# Patient Record
Sex: Male | Born: 1975 | Race: White | Hispanic: No | Marital: Married | Smoking: Never smoker
Health system: Southern US, Community
[De-identification: ages and names within clinical notes are randomized; demographics above are authoritative.]

## PROBLEM LIST (undated history)

## (undated) HISTORY — PX: APPENDECTOMY: SHX54

---

## 2004-08-19 ENCOUNTER — Observation Stay: Payer: Self-pay | Admitting: Surgery

## 2008-05-03 ENCOUNTER — Emergency Department: Payer: Self-pay | Admitting: Unknown Physician Specialty

## 2014-12-25 ENCOUNTER — Emergency Department
Admission: EM | Admit: 2014-12-25 | Discharge: 2014-12-25 | Disposition: A | Discharge status: Home / Self Care | Payer: No Typology Code available for payment source | Attending: Emergency Medicine | Admitting: Emergency Medicine

## 2014-12-25 ENCOUNTER — Emergency Department: Payer: No Typology Code available for payment source

## 2014-12-25 ENCOUNTER — Encounter: Payer: Self-pay | Admitting: Emergency Medicine

## 2014-12-25 DIAGNOSIS — S4992XA Unspecified injury of left shoulder and upper arm, initial encounter: Secondary | ICD-10-CM | POA: Insufficient documentation

## 2014-12-25 DIAGNOSIS — Y998 Other external cause status: Secondary | ICD-10-CM | POA: Diagnosis not present

## 2014-12-25 DIAGNOSIS — Y9389 Activity, other specified: Secondary | ICD-10-CM | POA: Insufficient documentation

## 2014-12-25 DIAGNOSIS — Y92410 Unspecified street and highway as the place of occurrence of the external cause: Secondary | ICD-10-CM | POA: Insufficient documentation

## 2014-12-25 DIAGNOSIS — S46002A Unspecified injury of muscle(s) and tendon(s) of the rotator cuff of left shoulder, initial encounter: Secondary | ICD-10-CM | POA: Insufficient documentation

## 2014-12-25 DIAGNOSIS — M25512 Pain in left shoulder: Secondary | ICD-10-CM

## 2014-12-25 DIAGNOSIS — M75102 Unspecified rotator cuff tear or rupture of left shoulder, not specified as traumatic: Secondary | ICD-10-CM

## 2014-12-25 MED ORDER — ETODOLAC 500 MG PO TABS: 500.0000 mg | ORAL_TABLET | Freq: Two times a day (BID) | ORAL | Status: AC

## 2014-12-25 MED ORDER — CYCLOBENZAPRINE HCL 5 MG PO TABS: 5.0000 mg | ORAL_TABLET | Freq: Three times a day (TID) | ORAL | Status: AC | PRN
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
## 2014-12-25 NOTE — ED Notes (Signed)
Discussed discharge instructions, prescriptions, and follow-up care with patient. No questions or concerns at this time. Pt stable at discharge.  
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
## 2019-06-09 ENCOUNTER — Emergency Department: Payer: BLUE CROSS/BLUE SHIELD

## 2019-06-09 ENCOUNTER — Other Ambulatory Visit: Payer: Self-pay

## 2019-06-09 ENCOUNTER — Emergency Department
Admission: EM | Admit: 2019-06-09 | Discharge: 2019-06-10 | Disposition: A | Discharge status: Home / Self Care | Payer: BLUE CROSS/BLUE SHIELD | Attending: Emergency Medicine | Admitting: Emergency Medicine

## 2019-06-09 DIAGNOSIS — R0781 Pleurodynia: Secondary | ICD-10-CM

## 2019-06-09 DIAGNOSIS — R600 Localized edema: Secondary | ICD-10-CM | POA: Diagnosis not present

## 2019-06-09 LAB — BRAIN NATRIURETIC PEPTIDE: B Natriuretic Peptide: 20 pg/mL (ref 0.0–100.0)

## 2019-06-09 LAB — FIBRIN DERIVATIVES D-DIMER (ARMC ONLY): Fibrin derivatives D-dimer (ARMC): 261.63 ng/mL (FEU) (ref 0.00–499.00)

## 2019-06-09 LAB — BASIC METABOLIC PANEL
Anion gap: 10 (ref 5–15)
BUN: 17 mg/dL (ref 6–20)
CO2: 25 mmol/L (ref 22–32)
Calcium: 9.2 mg/dL (ref 8.9–10.3)
Chloride: 102 mmol/L (ref 98–111)
Creatinine, Ser: 1.05 mg/dL (ref 0.61–1.24)
GFR calc Af Amer: >60 mL/min (ref >60)
GFR calc non Af Amer: >60 mL/min (ref >60)
Glucose, Bld: 107 mg/dL — ABNORMAL HIGH (ref 70–99)
Potassium: 3.6 mmol/L (ref 3.5–5.1)
Sodium: 137 mmol/L (ref 135–145)

## 2019-06-09 LAB — CBC
HCT: 48.9 % (ref 39.0–52.0)
Hemoglobin: 16.4 g/dL (ref 13.0–17.0)
MCH: 29.8 pg (ref 26.0–34.0)
MCHC: 33.5 g/dL (ref 30.0–36.0)
MCV: 88.7 fL (ref 80.0–100.0)
Platelets: 129 10*3/uL — ABNORMAL LOW (ref 150–400)
RBC: 5.51 MIL/uL (ref 4.22–5.81)
RDW: 11.9 % (ref 11.5–15.5)
WBC: 7 10*3/uL (ref 4.0–10.5)
nRBC: 0 % (ref 0.0–0.2)

## 2019-06-09 MED ORDER — SODIUM CHLORIDE 0.9% FLUSH: 3.0000 mL | Freq: Once | INTRAVENOUS | Status: DC

## 2019-06-09 MED ORDER — IOHEXOL 350 MG/ML SOLN
100.0000 mL | Freq: Once | INTRAVENOUS | Status: AC | PRN
  Administered 2019-06-09: 100 mL via INTRAVENOUS

## 2019-06-09 NOTE — ED Provider Notes (Addendum)
[facility] Emergency Department Provider Note   ____________________________________________   First MD Initiated Contact with Patient 06/09/19 2058     (approximate)  I have reviewed the triage vital signs and the nursing notes.   HISTORY  Chief Complaint Leg Swelling    HPI Scott Alexander is a 44 y.o. male patient complains of bilateral leg swelling. He has been wearing compression stockings but has some swelling anyway mostly down around the ankles he has some pain in the calves as well and some redness in his feet. He is worried about DVTs and PEs. He is having some chest pain with inspiration but gets better when he walks. I will bit of cough productive of some clear mucus. Not running a fever. Is been going on for several days.         History reviewed. No pertinent past medical history.  There are no problems to display for this patient.   History reviewed. No pertinent surgical history.  Prior to Admission medications   Medication Sig Start Date End Date Taking? Authorizing Provider  cyclobenzaprine (FLEXERIL) 5 MG tablet Take 1-2 tablets (5-10 mg total) by mouth every 8 (eight) hours as needed for muscle spasms. 12/25/14   Duanne Guess, PA-C  etodolac (LODINE) 500 MG tablet Take 1 tablet (500 mg total) by mouth 2 (two) times daily. 12/25/14   Duanne Guess, PA-C    Allergies Patient has no known allergies.  History reviewed. No pertinent family history.  Social History Social History   Tobacco Use  . Smoking status: Never Smoker  Substance Use Topics  . Alcohol use: Yes  . Drug use: No    Review of Systems  Constitutional: No fever/chills Eyes: No visual changes. ENT: No sore throat. Cardiovascular:  chest pain. Respiratory: Occasional slight shortness of breath. Gastrointestinal: No abdominal pain.  No nausea, no vomiting.  No diarrhea.  No constipation. Genitourinary: Negative for dysuria. Musculoskeletal:  Negative for back pain. Skin: Negative for rash. Neurological: Negative for headaches, focal weakness   ____________________________________________   PHYSICAL EXAM:  VITAL SIGNS: ED Triage Vitals  Enc Vitals Group     BP 06/09/19 1517 (!) 171/105     Pulse Rate 06/09/19 1517 79     Resp 06/09/19 1517 18     Temp 06/09/19 1517 98.4 F (36.9 C)     Temp Source 06/09/19 1517 Oral     SpO2 06/09/19 1517 97 %     Weight 06/09/19 1518 (!) 340 lb (154.2 kg)     Height 06/09/19 1518 6\' 2"  (1.88 m)     Head Circumference --      Peak Flow --      Pain Score 06/09/19 1517 1     Pain Loc --      Pain Edu? --      Excl. in [place]? --    Constitutional: Alert and oriented. Well appearing and in no acute distress. Head: Atraumatic. Nose: No congestion/rhinnorhea. Mouth/Throat: Mucous membranes are moist.  Oropharynx non-erythematous. Neck: No stridor.   Cardiovascular: Normal rate, regular rhythm. Grossly normal heart sounds.  Good peripheral circulation. Respiratory: Normal respiratory effort.  No retractions. Lungs CTAB. Gastrointestinal: Soft and nontender. No distention. No abdominal bruits. No CVA tenderness. Musculoskeletal: No lower extremity tenderness bilateral lower leg and ankle edema.   Neurologic:  Normal speech and language. No gross focal neurologic deficits are appreciated.  Skin:  Skin is warm, dry and intact. No rash noted. Minimal redness in  both feet on the dorsal parts   ____________________________________________   LABS (all labs ordered are listed, but only abnormal results are displayed)  Labs Reviewed  BASIC METABOLIC PANEL - Abnormal; Notable for the following components:      Result Value   Glucose, Bld 107 (*)    All other components within normal limits  CBC - Abnormal; Notable for the following components:   Platelets 129 (*)    All other components within normal limits  BRAIN NATRIURETIC PEPTIDE  FIBRIN DERIVATIVES D-DIMER ([facility] ONLY)    ____________________________________________  EKG EKG read interpreted by me shows normal sinus rhythm at 83 right axis decreased R wave progression in the chest leads and there is an S1Q3T3 present but in my experience this is only indicative of PE about 50% of the time.  ____________________________________________  RADIOLOGY  ED MD interpretation: Rest x-ray read by radiology reviewed by me is negative  Official radiology report(s): DG Chest 2 View  Result Date: 06/09/2019 CLINICAL DATA:  Bilateral lower extremity edema for 1 week, constant chest pain for 2 days EXAM: CHEST - 2 VIEW COMPARISON:  None. FINDINGS: Frontal and lateral views of the chest demonstrate an unremarkable cardiac silhouette. No airspace disease, effusion, or pneumothorax. No acute bony abnormalities. IMPRESSION: 1. No acute intrathoracic process. Electronically Signed   By: Sharlet Salina M.D.   On: 06/09/2019 17:39    ____________________________________________   PROCEDURES  Procedure(s) performed (including Critical Care):  Procedures   ____________________________________________   INITIAL IMPRESSION / ASSESSMENT AND PLAN / ED COURSE  Patient is EKG is okay except for his S1Q3T3 .  His D-dimer is negative his protein in his blood is okay his kidney function is okay and his ultrasound of his legs is negative except for a Baker's cyst.  I am not sure why he is having edema in his legs but there is no sign of a DVT.  With a negative D-dimer negative ultrasound CT could be avoided but after discussing this in detail with the patient he would rather be safer so we will go ahead and do CT angio.  I will sign him out.         ____________________________________________   FINAL CLINICAL IMPRESSION(S) / ED DIAGNOSES  Final diagnoses:  Leg edema  Pleuritic chest pain     ED Discharge Orders    None       Note:  This document was prepared using Dragon voice recognition software and may  include unintentional dictation errors.    Arnaldo Natal, MD 06/09/19 2315    Arnaldo Natal, MD 06/09/19 [phone]

## 2019-06-09 NOTE — ED Notes (Signed)
Pt currently in ultrasound

## 2019-06-09 NOTE — ED Notes (Signed)
Pt back from ultrasound, sitting in chair. Pt states coming in with SOB with deep breaths and leg swelling for over several days.  Pt denies current pain

## 2019-06-09 NOTE — ED Notes (Signed)
Pt transported to US at this time. 

## 2019-06-09 NOTE — ED Triage Notes (Signed)
Pt arrives via POV for c/o edema in both lower legs x 1 week accompanied with numbness in the toes. Pt has black compression stocks on both legs but there is noticeable swelling to both ankles. PT gait steady from lobby and pt is in NAD.

## 2019-06-09 NOTE — ED Notes (Signed)
Pt in CT.

## 2019-06-10 MED ORDER — NAPROXEN 500 MG PO TABS: 500.0000 mg | ORAL_TABLET | Freq: Two times a day (BID) | ORAL | 0 refills | Status: AC

## 2019-06-10 NOTE — ED Provider Notes (Signed)
Procedures     ----------------------------------------- 12:35 AM on 06/10/2019 ----------------------------------------- Vitals remain normal.  CT angiogram unremarkable.  Overall very reassuring work-up, stable for outpatient follow-up.     Sharman Cheek, MD 06/10/19 [phone]

## 2019-06-10 NOTE — Discharge Instructions (Addendum)
Your tests today were all okay, including blood tests, ultrasounds of the legs, and a CT scan of the chest.  We are unable to find an exact cause of your symptoms, but your evaluation is reassuring. Please follow up with primary care if your symptoms have not resolved in the next 2 days.  Anti-inflammatory medicine such as naproxen may help alleviate your symptoms.

## 2021-12-28 IMAGING — US US EXTREM LOW VENOUS
1 series · 14 of 24 positions shown · non-contrast
Comparison: None.

CLINICAL DATA: Bilateral leg swelling

EXAM:
BILATERAL LOWER EXTREMITY VENOUS DOPPLER ULTRASOUND
TECHNIQUE: Gray-scale sonography with compression, as well as color and duplex
ultrasound, were performed to evaluate the deep venous system(s)
from the level of the common femoral vein through the popliteal and
proximal calf veins.

[Series 1: us venous img lower bilat (dvt) · portal-venous · 14 of 63 slices shown]
[im 1/63]
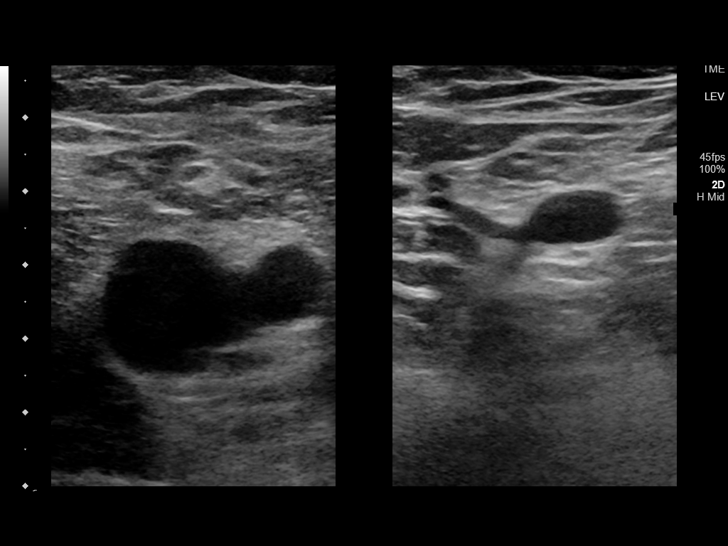
[im 6/63]
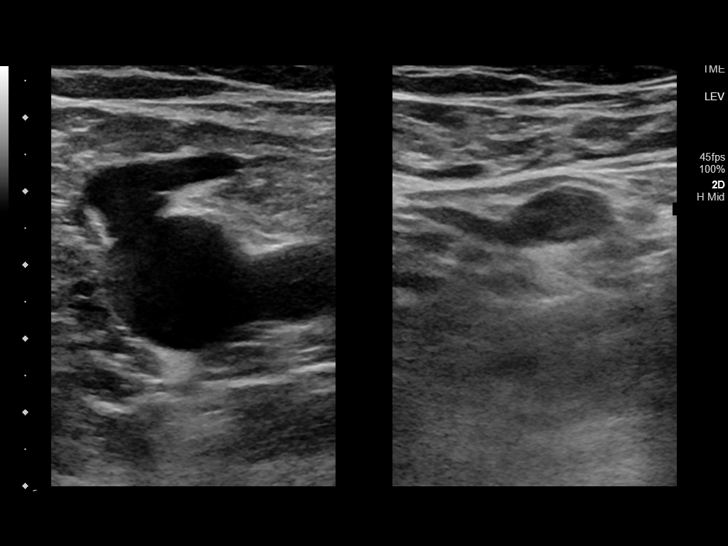
[im 11/63]
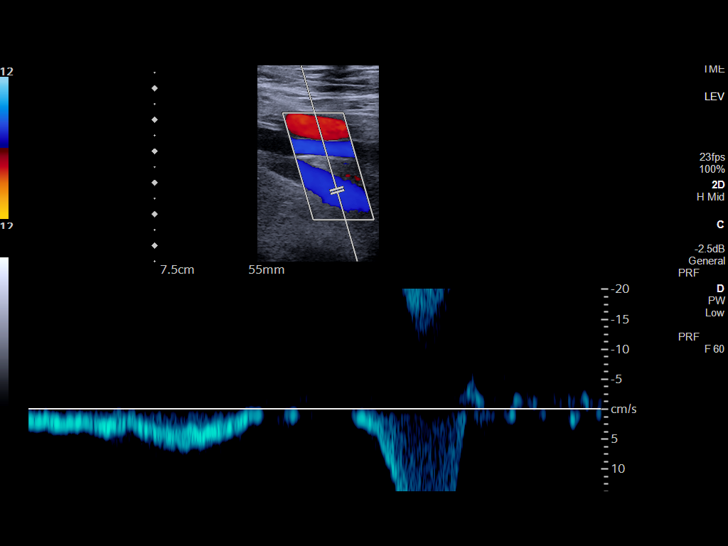
[im 17/63]
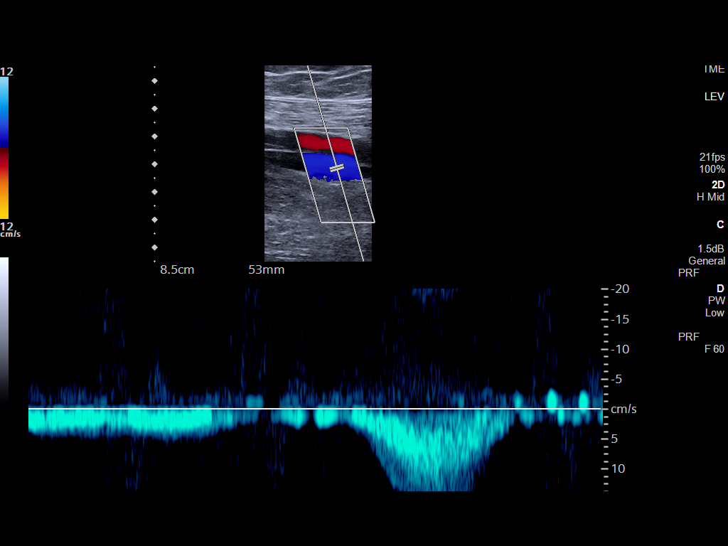
[im 19/63]
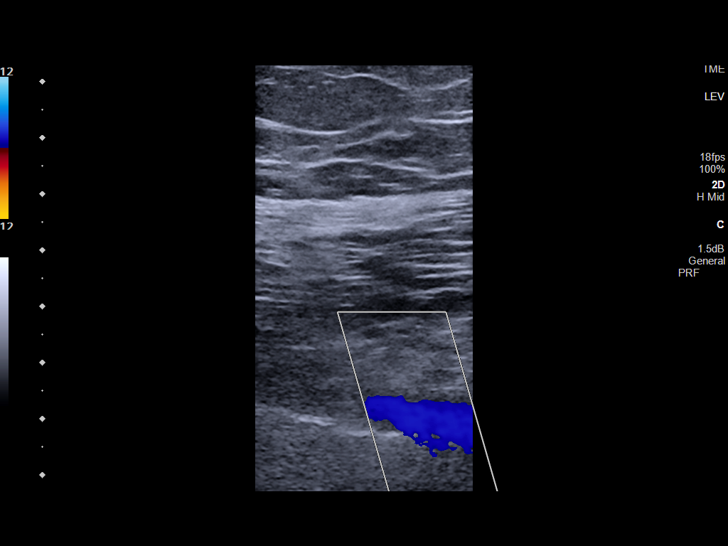
[im 25/63]
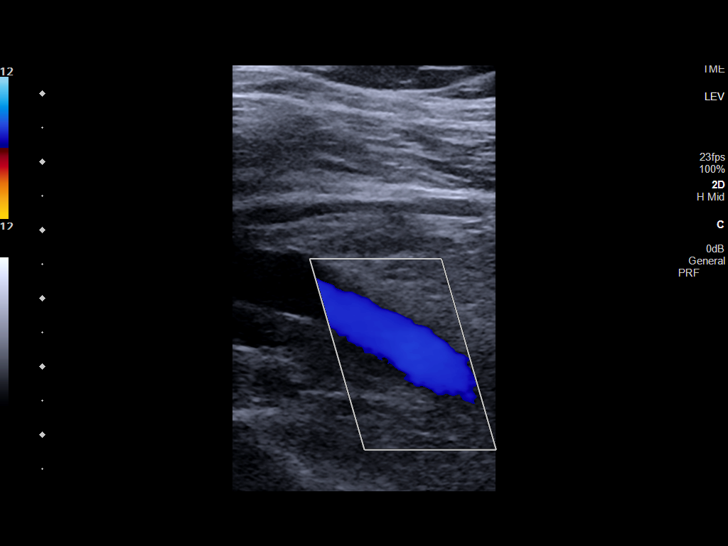
[im 30/63]
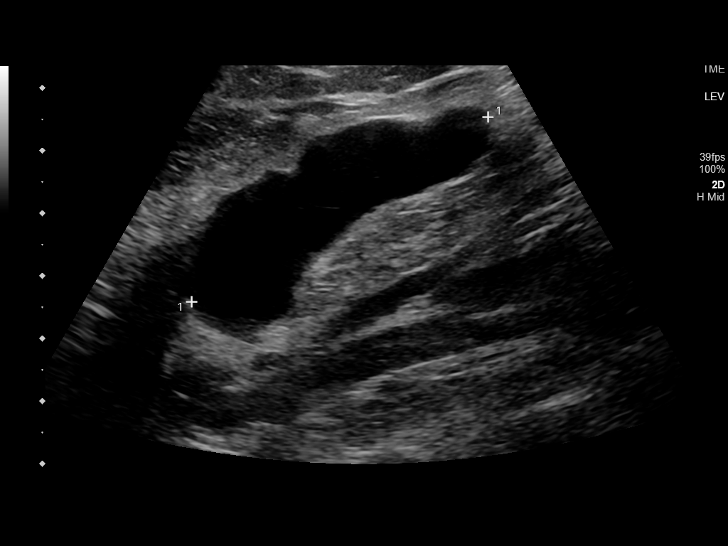
[im 33/63]
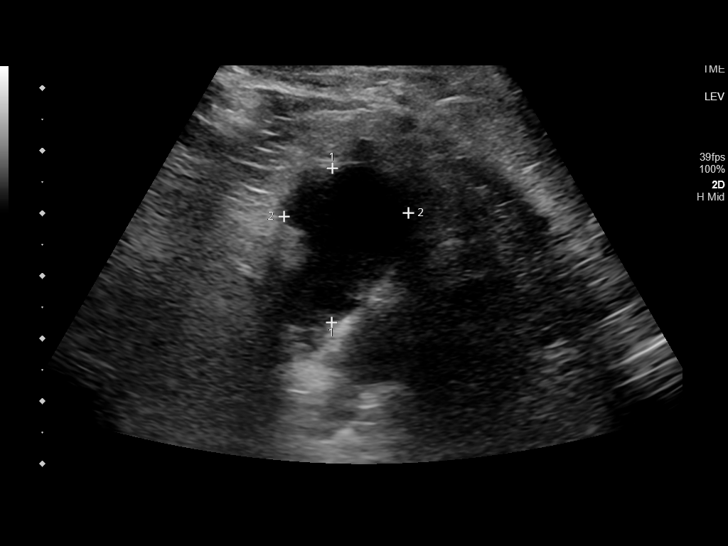
[im 38/63]
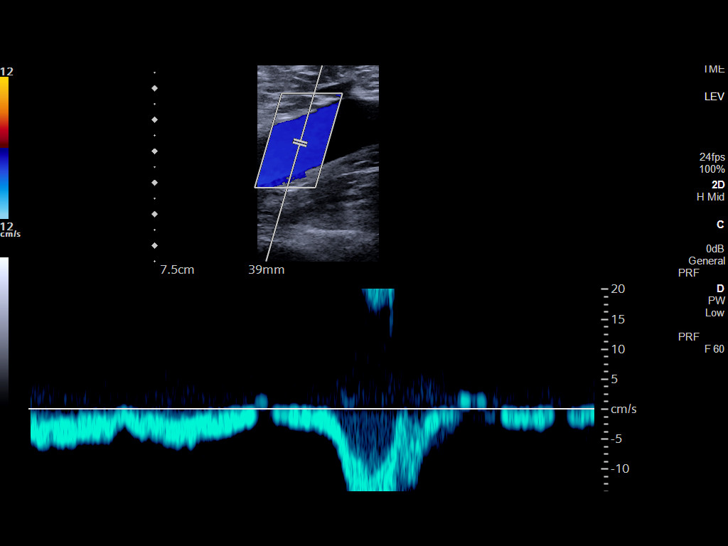
[im 44/63]
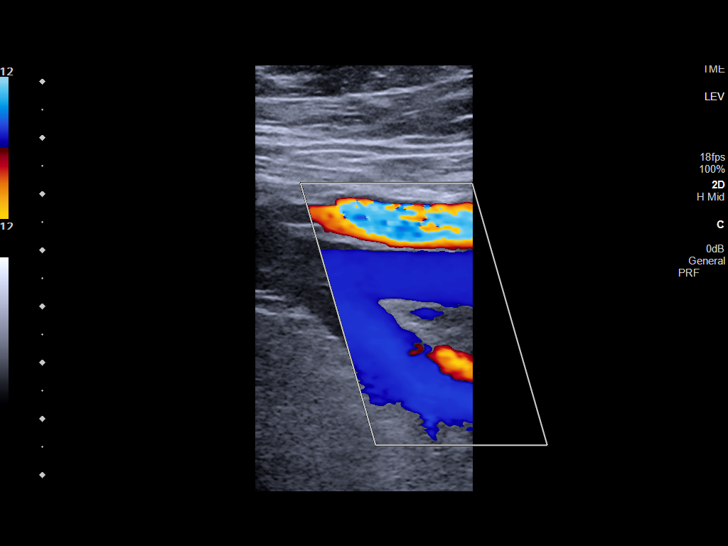
[im 49/63]
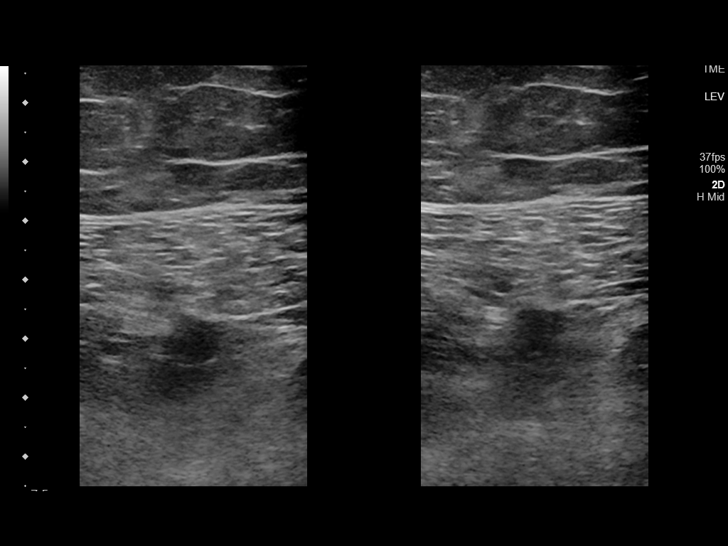
[im 52/63]
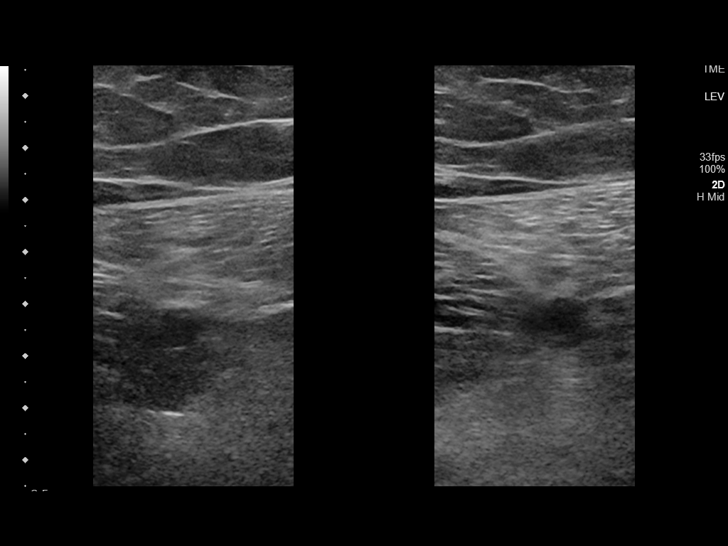
[im 57/63]
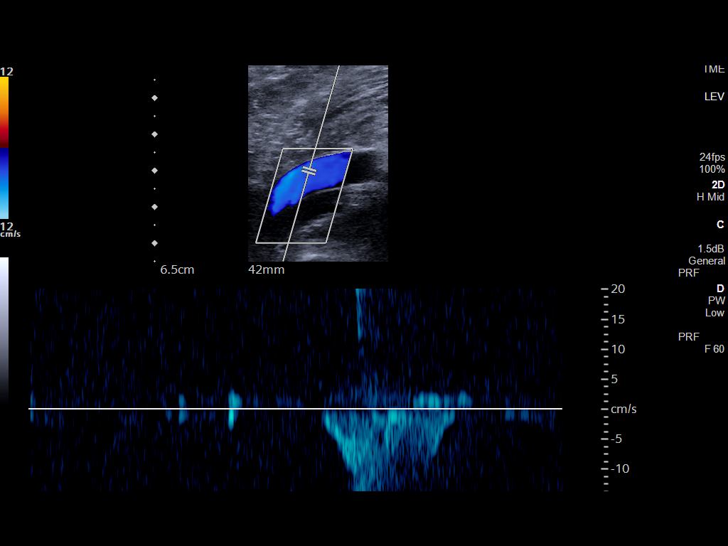
[im 63/63]
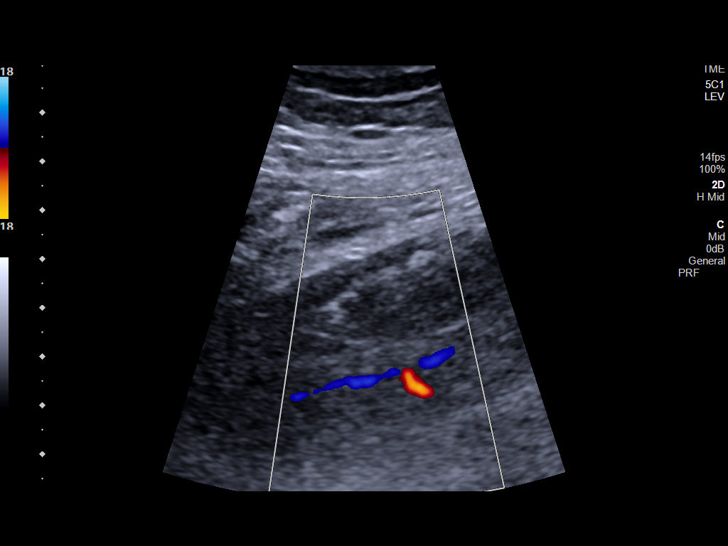

[14 of 24 positions shown; findings below may reference images not displayed]

FINDINGS: VENOUS

Normal compressibility of the common femoral, superficial femoral,
and popliteal veins, as well as the visualized calf veins.
Visualized portions of profunda femoral vein and great saphenous
vein unremarkable. No filling defects to suggest DVT on grayscale or
color Doppler imaging. Doppler waveforms show normal direction of
venous flow, normal respiratory plasticity and response to
augmentation.

OTHER

Left Baker's cyst measures 5.6 x 2.5 x 2.0 cm

Limitations: none
IMPRESSION: No evidence of lower extremity DVT.

Left Baker's cyst.

## 2021-12-28 IMAGING — CT CT ANGIO CHEST
2 of 6 series · 18 of 46 positions shown · IV contrast (APPLIED)
Comparison: Radiograph earlier today.

CLINICAL DATA: Shortness of breath. Bilateral leg swelling. Chest
pain.

EXAM:
CT ANGIOGRAPHY CHEST WITH CONTRAST
TECHNIQUE: Multidetector CT imaging of the chest was performed using the
standard protocol during bolus administration of intravenous
contrast. Multiplanar CT image reconstructions and MIPs were
obtained to evaluate the vascular anatomy.
CONTRAST:  100mL OMNIPAQUE IOHEXOL 350 MG/ML SOLN

[Series 5: thins · axial · 0.83mm/px · z∈[-663,-432]mm · 16 of 255 slices shown]
[im 12/255  lung]
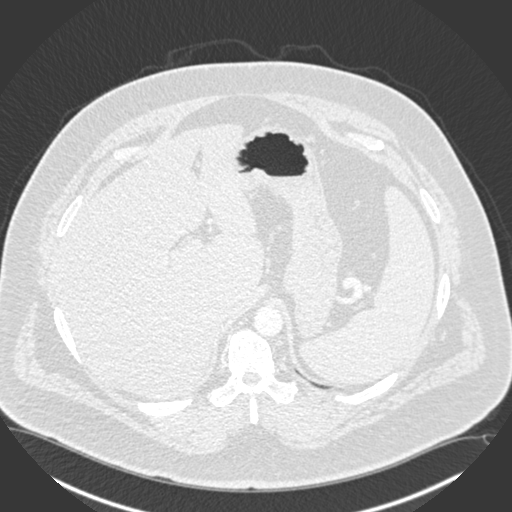
[im 34/255  soft-tissue]
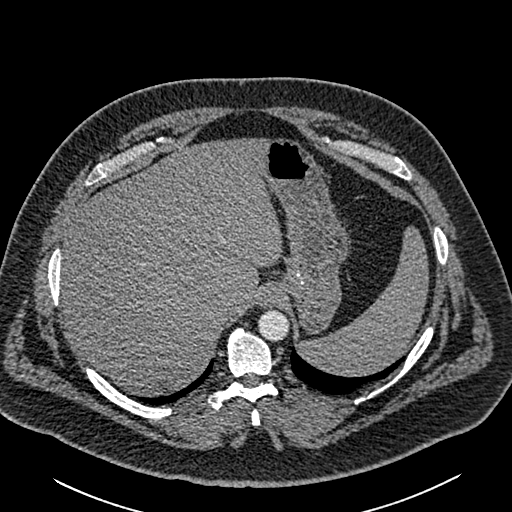
[im 45/255  lung]
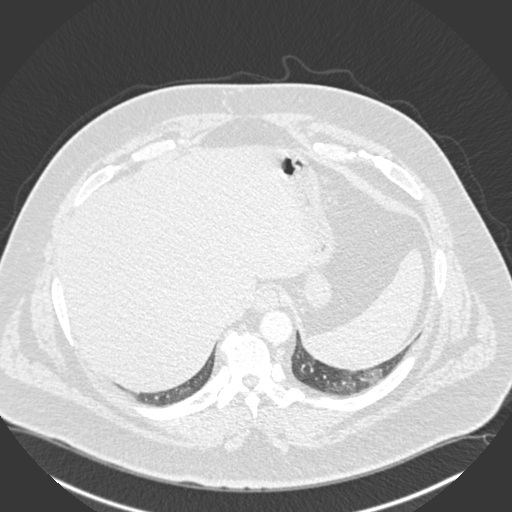
[im 56/255  soft-tissue]
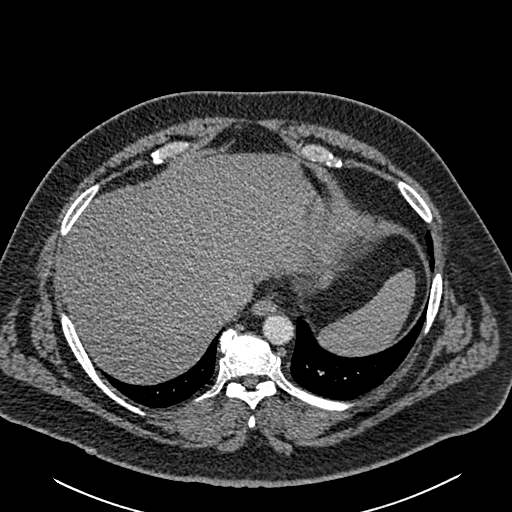
[im 78/255  lung]
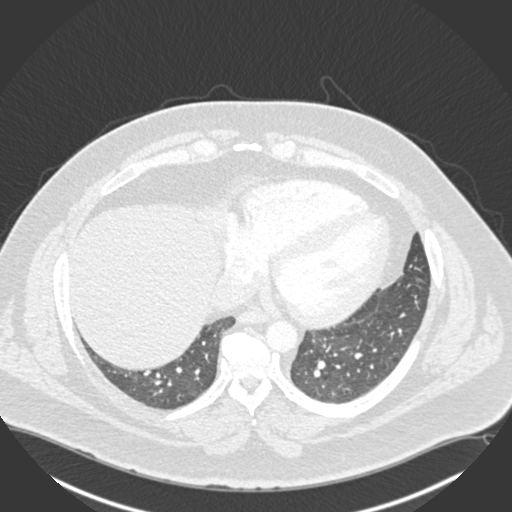
[im 89/255  soft-tissue]
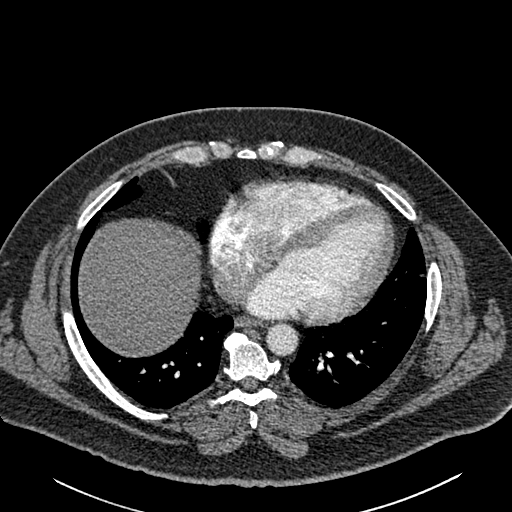
[im 100/255  lung]
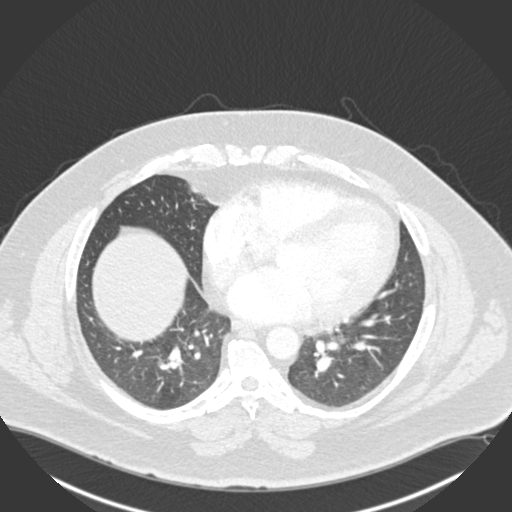
[im 122/255  soft-tissue]
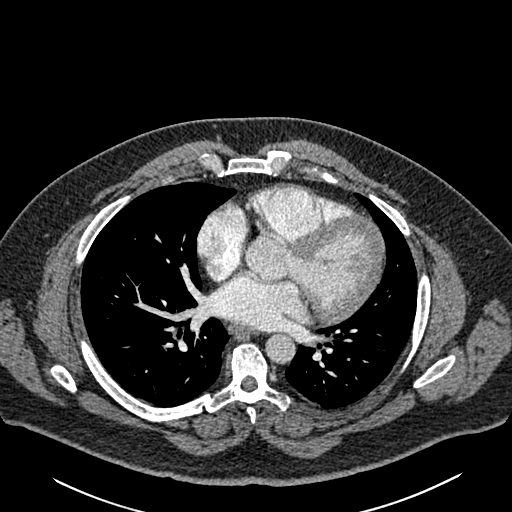
[im 133/255  lung]
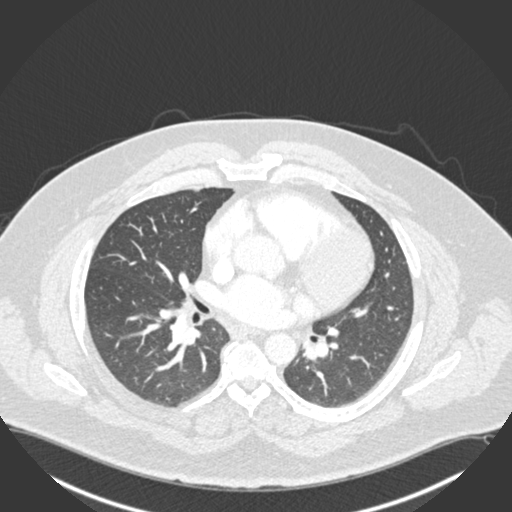
[im 155/255  soft-tissue]
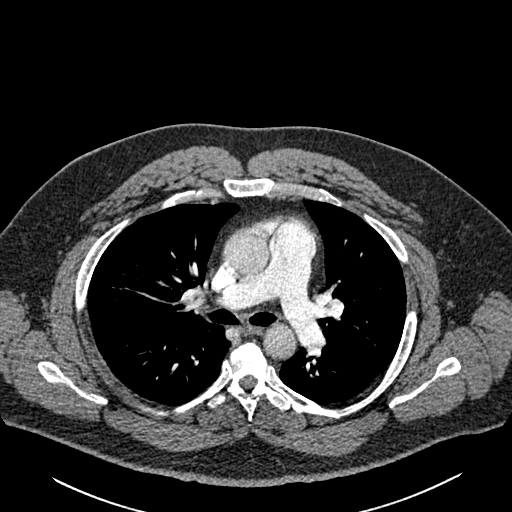
[im 166/255  lung]
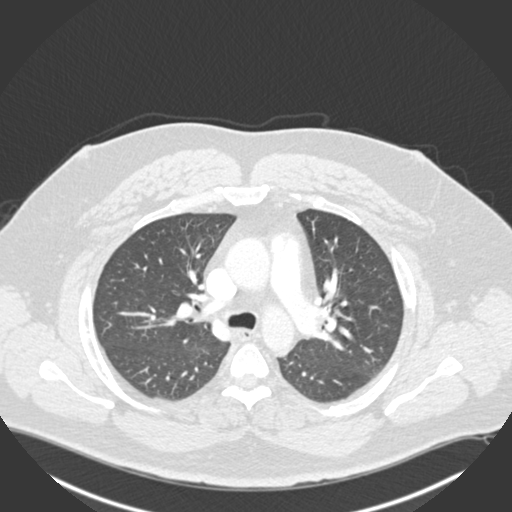
[im 177/255  soft-tissue]
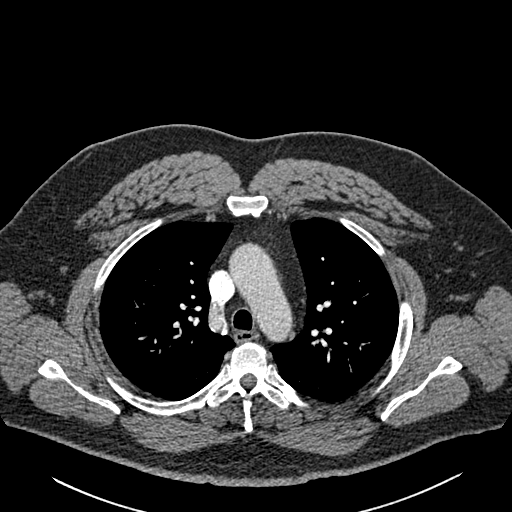
[im 199/255  lung]
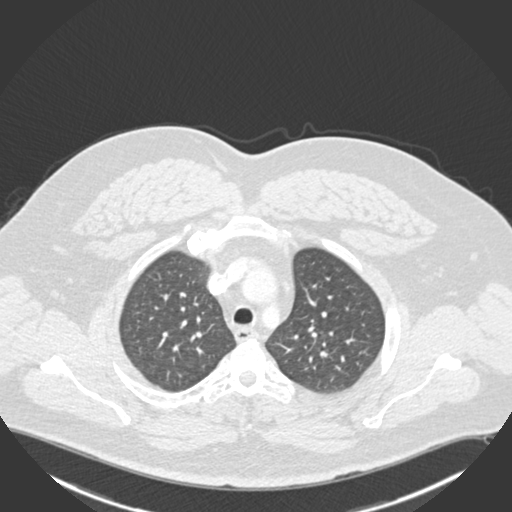
[im 210/255  soft-tissue]
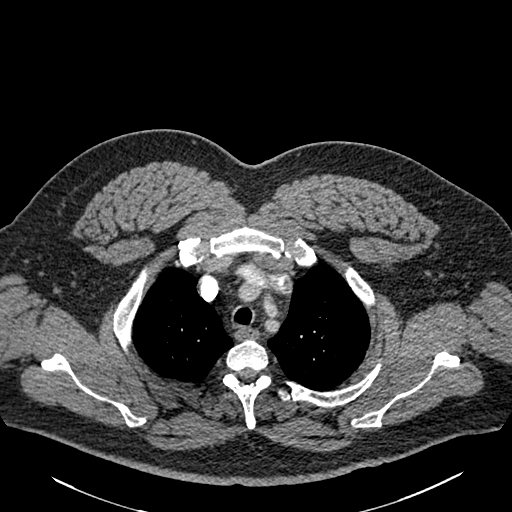
[im 221/255  lung]
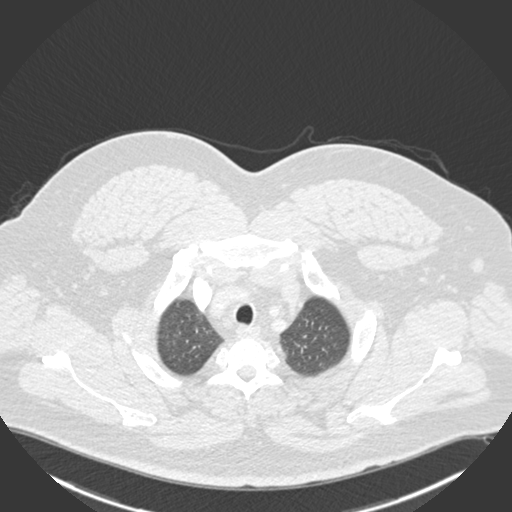
[im 243/255  soft-tissue]
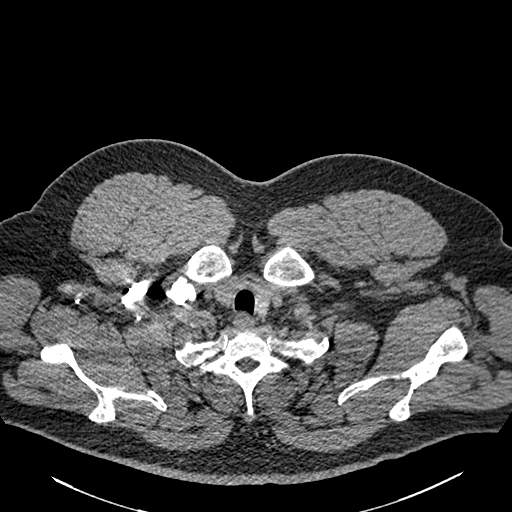

[Series 7: coronal mpr · coronal · 0.50mm/px · 2 of 100 slices shown]
[im 34/100  soft-tissue]
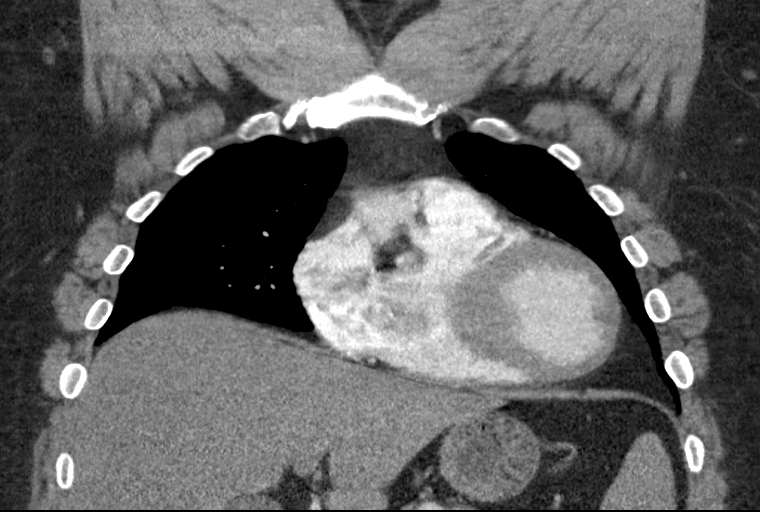
[im 67/100  soft-tissue]
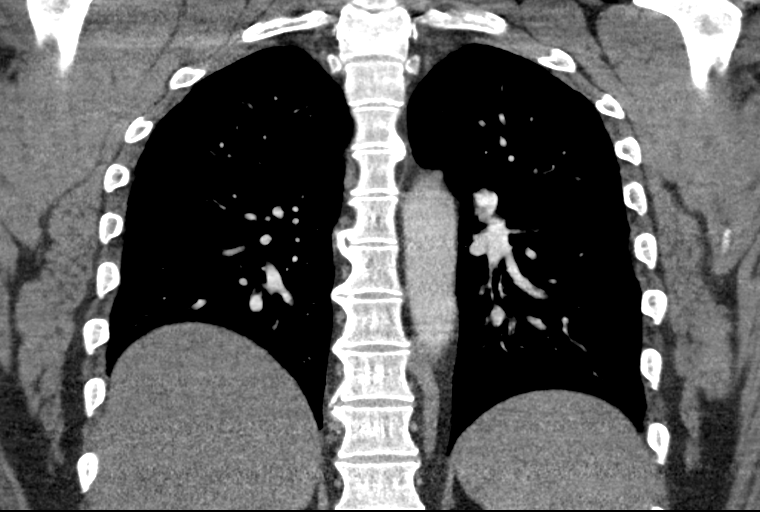

[18 of 46 positions shown; findings below may reference images not displayed]

FINDINGS: Cardiovascular: There are no filling defects within the pulmonary
arteries to suggest pulmonary embolus. Evaluation is diagnostic to
the segmental level. Subsegmental branches are not well assessed due
to soft tissue attenuation. No aortic dissection. Common origin of
the brachiocephalic and left common carotid artery, variant arch
anatomy. Upper normal heart size. No pericardial effusion.

Mediastinum/Nodes: No enlarged mediastinal or hilar lymph nodes.
Decompressed esophagus. No visualized thyroid nodule.

Lungs/Pleura: Minimal linear subsegmental atelectasis in the
lingula. Slight heterogeneous pulmonary parenchyma. No confluent
airspace disease. No pulmonary edema. No pleural fluid. No pulmonary
mass. Trachea and central mainstem bronchi are patent.

Upper Abdomen: Probable subcapsular cyst in the posterior liver. No
acute findings.

Musculoskeletal: Degenerative change in the spine. There are no
acute or suspicious osseous abnormalities.

Review of the MIP images confirms the above findings.
IMPRESSION: 1. No pulmonary embolus.
2. Slight heterogeneous pulmonary parenchyma, can be seen with small
airways disease.

## 2023-05-13 ENCOUNTER — Other Ambulatory Visit: Payer: Self-pay

## 2023-05-13 ENCOUNTER — Ambulatory Visit: Admission: EM | Admit: 2023-05-13 | Discharge: 2023-05-13 | Disposition: A | Discharge status: Home / Self Care

## 2023-05-13 DIAGNOSIS — L309 Dermatitis, unspecified: Secondary | ICD-10-CM | POA: Diagnosis not present

## 2023-05-13 DIAGNOSIS — R21 Rash and other nonspecific skin eruption: Secondary | ICD-10-CM

## 2023-05-13 DIAGNOSIS — L03115 Cellulitis of right lower limb: Secondary | ICD-10-CM | POA: Diagnosis not present

## 2023-05-13 DIAGNOSIS — L03116 Cellulitis of left lower limb: Secondary | ICD-10-CM

## 2023-05-13 DIAGNOSIS — L089 Local infection of the skin and subcutaneous tissue, unspecified: Secondary | ICD-10-CM

## 2023-05-13 LAB — CBC WITH DIFFERENTIAL/PLATELET
Abs Immature Granulocytes: 0.05 10*3/uL (ref 0.00–0.07)
Basophils Absolute: 0.1 10*3/uL (ref 0.0–0.1)
Basophils Relative: 1 %
Eosinophils Absolute: 0.2 10*3/uL (ref 0.0–0.5)
Eosinophils Relative: 2 %
HCT: 44.6 % (ref 39.0–52.0)
Hemoglobin: 14.6 g/dL (ref 13.0–17.0)
Immature Granulocytes: 1 %
Lymphocytes Relative: 16 %
Lymphs Abs: 1.4 10*3/uL (ref 0.7–4.0)
MCH: 30 pg (ref 26.0–34.0)
MCHC: 32.7 g/dL (ref 30.0–36.0)
MCV: 91.8 fL (ref 80.0–100.0)
Monocytes Absolute: 0.9 10*3/uL (ref 0.1–1.0)
Monocytes Relative: 11 %
Neutro Abs: 5.8 10*3/uL (ref 1.7–7.7)
Neutrophils Relative %: 69 %
Platelets: 125 10*3/uL — ABNORMAL LOW (ref 150–400)
RBC: 4.86 MIL/uL (ref 4.22–5.81)
RDW: 12.1 % (ref 11.5–15.5)
WBC: 8.3 10*3/uL (ref 4.0–10.5)
nRBC: 0 % (ref 0.0–0.2)

## 2023-05-13 LAB — COMPREHENSIVE METABOLIC PANEL WITH GFR
ALT: 20 U/L (ref 0–44)
AST: 19 U/L (ref 15–41)
Albumin: 3.9 g/dL (ref 3.5–5.0)
Alkaline Phosphatase: 58 U/L (ref 38–126)
Anion gap: 11 (ref 5–15)
BUN: 15 mg/dL (ref 6–20)
CO2: 28 mmol/L (ref 22–32)
Calcium: 8.8 mg/dL — ABNORMAL LOW (ref 8.9–10.3)
Chloride: 96 mmol/L — ABNORMAL LOW (ref 98–111)
Creatinine, Ser: 0.97 mg/dL (ref 0.61–1.24)
GFR, Estimated: >60 mL/min (ref >60)
Glucose, Bld: 119 mg/dL — ABNORMAL HIGH (ref 70–99)
Potassium: 3.8 mmol/L (ref 3.5–5.1)
Sodium: 135 mmol/L (ref 135–145)
Total Bilirubin: 1.5 mg/dL — ABNORMAL HIGH (ref 0.0–1.2)
Total Protein: 7.6 g/dL (ref 6.5–8.1)

## 2023-05-13 NOTE — ED Provider Notes (Signed)
 Scott Alexander    CSN: [id number] Arrival date & time: 05/13/23  1748      History   Chief Complaint Chief Complaint  Patient presents with   Rash    HPI Scott Alexander is a 48 y.o. male.  Patient presents with 2-week history of rash, swelling, redness which started on his lower legs after he was working in his yard.  The rash has spread to his entire body.  Some of the areas have purulent drainage.  His legs are painful.  He denies fever or chills.  He has been treating the area with calamine lotion and Benadryl.  The history is provided by the patient and medical records.    History reviewed. No pertinent past medical history.  There are no active problems to display for this patient.   Past Surgical History:  Procedure Laterality Date   APPENDECTOMY         Home Medications    Prior to Admission medications   Medication Sig Start Date End Date Taking? Authorizing Provider  cyclobenzaprine  (FLEXERIL ) 5 MG tablet Take 1-2 tablets (5-10 mg total) by mouth every 8 (eight) hours as needed for muscle spasms. Patient not taking: Reported on 05/13/2023 12/25/14   Coralyn Derry, PA-C  etodolac  (LODINE ) 500 MG tablet Take 1 tablet (500 mg total) by mouth 2 (two) times daily. Patient not taking: Reported on 05/13/2023 12/25/14   Coralyn Derry, PA-C  naproxen  (NAPROSYN ) 500 MG tablet Take 1 tablet (500 mg total) by mouth 2 (two) times daily with a meal. Patient not taking: Reported on 05/13/2023 06/10/19   Jacquie Maudlin, MD    Family History History reviewed. No pertinent family history.  Social History Social History   Tobacco Use   Smoking status: Never  Substance Use Topics   Alcohol use: Yes   Drug use: No     Allergies   Patient has no known allergies.   Review of Systems Review of Systems  Constitutional:  Negative for chills and fever.  Musculoskeletal:  Positive for arthralgias and joint swelling. Negative for gait problem.  Skin:  Positive  for color change, rash and wound.  Neurological:  Negative for weakness and numbness.     Physical Exam Triage Vital Signs ED Triage Vitals [05/13/23 1803]  Encounter Vitals Group     BP 139/89     Systolic BP Percentile      Diastolic BP Percentile      Pulse Rate 79     Resp 18     Temp 97.9 F (36.6 C)     Temp src      SpO2 98 %     Weight      Height      Head Circumference      Peak Flow      Pain Score      Pain Loc      Pain Education      Exclude from Growth Chart    No data found.  Updated Vital Signs BP 139/89   Pulse 79   Temp 97.9 F (36.6 C)   Resp 18   SpO2 98%   Visual Acuity Right Eye Distance:   Left Eye Distance:   Bilateral Distance:    Right Eye Near:   Left Eye Near:    Bilateral Near:     Physical Exam Constitutional:      General: He is not in acute distress.    Appearance: He is obese.  HENT:     Mouth/Throat:     Mouth: Mucous membranes are moist.  Cardiovascular:     Rate and Rhythm: Regular rhythm.  Pulmonary:     Effort: Pulmonary effort is normal. No respiratory distress.  Musculoskeletal:        General: Swelling and tenderness present. Normal range of motion.  Skin:    General: Skin is warm and dry.     Findings: Erythema, lesion and rash present.     Comments: See pictures.  Neurological:     General: No focal deficit present.     Mental Status: He is alert.     Sensory: No sensory deficit.     Motor: No weakness.     Gait: Gait normal.               UC Treatments / Results  Labs (all labs ordered are listed, but only abnormal results are displayed) Labs Reviewed - No data to display  EKG   Radiology No results found.  Procedures Procedures (including critical care time)  Medications Ordered in UC Medications - No data to display  Initial Impression / Assessment and Plan / UC Course  I have reviewed the triage vital signs and the nursing notes.  Pertinent labs & imaging results that  were available during my care of the patient were reviewed by me and considered in my medical decision making (see chart for details).   Cellulitis of both legs, skin infection, rash.  Afebrile.  Patient has extensive cellulitis and rash.  Some areas have purulent drainage.  Because the cellulitis is so widespread on both legs, sending patient to the ED for evaluation.  He is agreeable to this and will drive himself to the ED.   Final Clinical Impressions(s) / UC Diagnoses   Final diagnoses:  Cellulitis of leg, left  Cellulitis of leg, right  Skin infection  Rash     Discharge Instructions      Go to the emergency department for evaluation of your extensive cellulitis and skin infection.   ED Prescriptions   None    PDMP not reviewed this encounter.   Wellington Half, NP 05/13/23 [id number]

## 2023-05-13 NOTE — ED Notes (Signed)
 Patient is being discharged from the Urgent Care and sent to the Emergency Department via POV . Per Leanor Proper NP, patient is in need of higher level of care due to cellulitis/ skin infection. Patient is aware and verbalizes understanding of plan of care.  Vitals:   05/13/23 1803  BP: 139/89  Pulse: 79  Resp: 18  Temp: 97.9 F (36.6 C)  SpO2: 98%

## 2023-05-13 NOTE — ED Triage Notes (Addendum)
 Patient to Urgent Care with complaints of a rash present to entire body (bilateral arms/ legs/ abdomen). Initially itching, now dry/ red and weeping. Reports at times has "stabbing" pain.   Symptoms started two weeks ago after he first mowed his lawn this year. Started on his lower legs. Now having swelling in his bilateral feet and redness up into his calves. Areas are weeping w/ yellow drainage.   Using calamine lotion/ benadryl/ lotion.

## 2023-05-13 NOTE — ED Notes (Signed)
1 set of cultures sent to lab  

## 2023-05-13 NOTE — ED Triage Notes (Signed)
 Patient states bilateral leg swelling and reddness; scaly rash all over body. Was seen at [facility] and sent over for further workup.

## 2023-05-13 NOTE — Discharge Instructions (Addendum)
 Go to the emergency department for evaluation of your extensive cellulitis and skin infection.

## 2023-05-14 ENCOUNTER — Emergency Department
Admission: EM | Admit: 2023-05-14 | Discharge: 2023-05-14 | Disposition: A | Discharge status: Home / Self Care | Attending: Emergency Medicine | Admitting: Emergency Medicine

## 2023-05-14 DIAGNOSIS — L309 Dermatitis, unspecified: Secondary | ICD-10-CM

## 2023-05-14 MED ORDER — DOXYCYCLINE HYCLATE 100 MG PO TABS
100.0000 mg | ORAL_TABLET | Freq: Once | ORAL | Status: AC
  Administered 2023-05-14: 100 mg via ORAL
  Filled 2023-05-14: qty 1

## 2023-05-14 MED ORDER — LEVOFLOXACIN 500 MG PO TABS
750.0000 mg | ORAL_TABLET | Freq: Once | ORAL | Status: AC
  Administered 2023-05-14: 750 mg via ORAL
  Filled 2023-05-14: qty 2

## 2023-05-14 MED ORDER — PREDNISONE 10 MG PO TABS: ORAL_TABLET | ORAL | 0 refills | Status: AC

## 2023-05-14 MED ORDER — PREDNISONE 20 MG PO TABS
60.0000 mg | ORAL_TABLET | ORAL | Status: AC
  Administered 2023-05-14: 60 mg via ORAL
  Filled 2023-05-14: qty 3

## 2023-05-14 MED ORDER — DOXYCYCLINE HYCLATE 100 MG PO CAPS: 100.0000 mg | ORAL_CAPSULE | Freq: Two times a day (BID) | ORAL | 0 refills | Status: AC

## 2023-05-14 MED ORDER — LEVOFLOXACIN 750 MG PO TABS: 750.0000 mg | ORAL_TABLET | Freq: Every day | ORAL | 0 refills | Status: AC

## 2023-05-14 NOTE — Discharge Instructions (Addendum)
 As we discussed, please take the full course of both antibiotics prescribed as well as the prednisone taper you were prescribed.  We strongly encourage you to call today to the [facility] dermatology clinic to set up the next available follow-up appointment.  We also provided you with information about how to call to schedule a PCP appointment to establish a local primary care provider.  If you get worse or develop new or worsening symptoms that concern you, including but not limited to fever/chills, nausea, vomiting, weakness, etc., please return to the nearest emergency department.

## 2023-05-14 NOTE — ED Provider Notes (Signed)
 [facility] Provider Note    Event Date/Time   First MD Initiated Contact with Patient 05/14/23 0011     (approximate)   History   Leg Swelling   HPI Scott Alexander is a 48 y.o. male who presents from urgent care for further evaluation of skin rash.  Patient reports that about 2 weeks ago he developed a rash on his lower legs after mowing the lawn and doing some other yard work.  He said it looked like poison ivy initially.  He states that after it started, he was scratching a lot, and then he thought it might be better if he got in a hot tub.  He said he scratched a lot and opened up some wounds while he was in the hot tub.  After that it seemed like the rash spread all over his body as seen in the photos below.  His legs have become more swollen and red and now the left leg is painful.  He has no history of diabetes or heart failure.  He has had no fever, chest pain, shortness of breath, nausea, vomiting, nor abdominal pain.  He has had no immobilizations or surgeries recently, no history of blood clots, and both lower legs are affected equally in terms of the swelling and rash although the pain is a little bit worse on the left side.  He is not immunocompromised in any way of which he is aware.     Physical Exam   Triage Vital Signs: ED Triage Vitals  Encounter Vitals Group     BP 05/13/23 1846 (!) 160/93     Systolic BP Percentile --      Diastolic BP Percentile --      Pulse Rate 05/13/23 1843 84     Resp 05/13/23 1843 18     Temp 05/13/23 1843 98.3 F (36.8 C)     Temp Source 05/13/23 1843 Oral     SpO2 05/13/23 1843 98 %     Weight 05/13/23 1845 (!) 156.5 kg (345 lb)     Height 05/13/23 1845 1.88 m (6\' 2" )     Head Circumference --      Peak Flow --      Pain Score 05/13/23 1845 5     Pain Loc --      Pain Education --      Exclude from Growth Chart --     Most recent vital signs: Vitals:   05/13/23 1846 05/14/23 0100  BP: (!) 160/93 (!)  141/77  Pulse:  83  Resp:  18  Temp:  99.1 F (37.3 C)  SpO2:  99%    General: Awake, no distress.  HEENT: No facial rash.  No mucosal lesions. CV:  Good peripheral perfusion.  Regular rate and rhythm. Resp:  Normal effort. Speaking easily and comfortably, no accessory muscle usage nor intercostal retractions.   Abd:  No distention. Obese, nontender abdomen. Other:  Extensive maculopapular rash with plaques on tear and posterior legs and torso.  Posterior right leg looks the worst, but there is no purulence although there is some crusting on the right lower leg that looks more along the lines of impetigo.  Despite the crusted lesions, there are no large blisters or bullae at this time.  Please note that the photos below were taken at the patient's previous visit to the urgent care earlier today.             ED Results / Procedures /  Treatments   Labs (all labs ordered are listed, but only abnormal results are displayed) Labs Reviewed  COMPREHENSIVE METABOLIC PANEL WITH GFR - Abnormal; Notable for the following components:      Result Value   Chloride 96 (*)    Glucose, Bld 119 (*)    Calcium 8.8 (*)    Total Bilirubin 1.5 (*)    All other components within normal limits  CBC WITH DIFFERENTIAL/PLATELET - Abnormal; Notable for the following components:   Platelets 125 (*)    All other components within normal limits  CULTURE, BLOOD (ROUTINE X 2)  CULTURE, BLOOD (ROUTINE X 2)      PROCEDURES:  Critical Care performed: No  Procedures    IMPRESSION / MDM / ASSESSMENT AND PLAN / ED COURSE  I reviewed the triage vital signs and the nursing notes.                              Differential diagnosis includes, but is not limited to, dermatitis including venous stasis dermatitis, contact dermatitis, cellulitis, impetigo, pseudomonal infection, scalded skin syndrome, less likely Legionella skin infection.  Drug reaction unlikely given the patient does not take any  medications.  No evidence of TEN/SJS at this time.  Patient's presentation is most consistent with acute presentation with potential threat to life or bodily function.  Labs/studies ordered: Blood cultures x 2, CMP, CBC with differential.  Interventions/Medications given:  Medications  predniSONE (DELTASONE) tablet 60 mg (has no administration in time range)  levofloxacin (LEVAQUIN) tablet 750 mg (has no administration in time range)  doxycycline (VIBRA-TABS) tablet 100 mg (has no administration in time range)    (Note:  hospital course my include additional interventions and/or labs/studies not listed above.)   Despite the extensive and worrisome appearance of the patient's rash, he does not meet SIRS criteria given his normal vital signs and no leukocytosis, which is reassuring.  Blood cultures pending.  CMP is normal.  The patient is essentially asymptomatic and has had this rash for 2 weeks.  If he had any systemic signs of illness/infection, I would feel he needs admission, but this does not appear to be the case.  Given the possibility of pseudomonal infection given the hot tub exposure and the appearance of the lesions, I feel he needs broad empiric antibiotic coverage and I will treat him with Levaquin 750 mg p.o. daily as well as doxycycline 100 mg p.o. twice daily to give the added MRSA coverage.  I feel that even though Levaquin can have side effects, it is the best choice for him given that it will cover Pseudomonas infections, dermatitis, impetigo, etc.  Given the probability of a systemic inflammatory reaction and that this may have started as contact dermatitis, I will also give him a prednisone taper starting at 60 mg.  I gave the patient strict return precautions and follow-up recommendations with dermatology as well as referring him to primary care because he does not have a PCP.  He says he understands and agrees with the plan.  I strongly considered hospitalization but at  this point there is no medical indication for hospitalization or transfer.       FINAL CLINICAL IMPRESSION(S) / ED DIAGNOSES   Final diagnoses:  Dermatitis     Rx / DC Orders   ED Discharge Orders          Ordered    levofloxacin (LEVAQUIN) 750 MG tablet  Daily  05/14/23 0200    doxycycline (VIBRAMYCIN) 100 MG capsule  2 times daily        05/14/23 0200    predniSONE (DELTASONE) 10 MG tablet        05/14/23 0200             Note:  This document was prepared using Dragon voice recognition software and may include unintentional dictation errors.   Lynnda Sas, MD 05/14/23 0201

## 2023-05-19 LAB — CULTURE, BLOOD (ROUTINE X 2)
Culture: NO GROWTH
Culture: NO GROWTH
Special Requests: ADEQUATE

## 2023-09-02 ENCOUNTER — Encounter: Payer: Self-pay | Admitting: Family Medicine

## 2023-09-02 ENCOUNTER — Ambulatory Visit (INDEPENDENT_AMBULATORY_CARE_PROVIDER_SITE_OTHER): Admitting: Family Medicine

## 2023-09-02 VITALS — BP 125/85 | HR 99 | Resp 16 | Ht 74.0 in | Wt 330.0 lb

## 2023-09-02 DIAGNOSIS — R600 Localized edema: Secondary | ICD-10-CM | POA: Insufficient documentation

## 2023-09-02 DIAGNOSIS — L03119 Cellulitis of unspecified part of limb: Secondary | ICD-10-CM | POA: Diagnosis not present

## 2023-09-02 DIAGNOSIS — R030 Elevated blood-pressure reading, without diagnosis of hypertension: Secondary | ICD-10-CM | POA: Insufficient documentation

## 2023-09-02 DIAGNOSIS — Z1159 Encounter for screening for other viral diseases: Secondary | ICD-10-CM

## 2023-09-02 DIAGNOSIS — L308 Other specified dermatitis: Secondary | ICD-10-CM

## 2023-09-02 MED ORDER — MUPIROCIN 2 % EX OINT: 1.0000 | TOPICAL_OINTMENT | Freq: Two times a day (BID) | CUTANEOUS | 4 refills | Status: AC

## 2023-09-02 MED ORDER — HYDROXYZINE PAMOATE 25 MG PO CAPS: 25.0000 mg | ORAL_CAPSULE | Freq: Three times a day (TID) | ORAL | 0 refills | Status: AC | PRN

## 2023-09-02 MED ORDER — SULFAMETHOXAZOLE-TRIMETHOPRIM 800-160 MG PO TABS: 2.0000 | ORAL_TABLET | Freq: Two times a day (BID) | ORAL | 0 refills | Status: AC

## 2023-09-02 NOTE — Progress Notes (Signed)
 New Patient Office Visit  Introduced to nurse practitioner role and practice setting.  All questions answered.  Discussed provider/patient relationship and expectations.  Subjective    Patient ID: Scott Alexander, male    DOB: 13-Jun-1975  Age: 48 y.o. MRN: [id number]  CC:  Chief Complaint  Patient presents with   New Patient (Initial Visit)    NP/ pt wants to discuss his edema.    HPI  Discussed the use of AI scribe software for clinical note transcription with the patient, who gave verbal consent to proceed.  History of Present Illness Scott Alexander is a 48 year old male who presents with a persistent rash and swelling in his legs.  He has experienced a significant rash and swelling in his legs since May. The rash initially spread from his legs to his stomach but has since improved, with the redness almost resolved. The rash appeared after mowing the lawn for the first time this season. He uses Vaseline to manage dryness and itching, which worsens when the skin dries out. He previously took Benadryl for itching but has since stopped.  The swelling in his legs is more severe than in the past, with noticeable indentations. He has a history of ankle swelling but states it was not as severe as the current condition. He has not experienced similar swelling in other areas, although his wedding band feels tight in the morning, suggesting some hand swelling. He attributes some of the leg swelling to sitting lower at home since transitioning to remote work, which has reduced his mobility during the day.  In May, he underwent blood testing, which did not reveal any abnormalities with CBC, CMP, or blood cultures. He recalls having an ultrasound of his legs four years ago, which showed no issues at that time. No shortness of breath, cardiac symptoms, or changes in energy levels. He reports increased urination at night (x2), especially on non-exercise days, but considers it normal for him.  He has a  history of elevated blood sugar levels, with a reading of 119, three months ago, which he attributes to having eaten before the test. He drinks two cans of Coca-Cola daily and a gallon of skim milk weekly. He has not consulted a nutritionist, although he has received some dietary advice from a gym nutritionist. He describes his diet as generally healthy during the week, with more indulgent eating on weekends.  He exercises regularly, going to the gym four days a week for two hours each session. 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 He drinks alcohol socially, primarily in the summer, and smokes cigars occasionally. He has not experienced any numbness or tingling, and his previous sugar tests were normal.   Flowsheet Row Office Visit from 09/02/2023 in [facility]  PHQ-9 Total Score 0      09/02/2023    2:30 PM  GAD 7 : Generalized Anxiety Score  Nervous, Anxious, on Edge 0  Control/stop worrying 0  Worry too much - different things 0  Trouble relaxing 0  Restless 0  Easily annoyed or irritable 0  Afraid - awful might happen 0  Total GAD 7 Score 0  Anxiety Difficulty Not difficult at all     Outpatient Encounter Medications as of 09/02/2023  Medication Sig   hydrOXYzine  (VISTARIL ) 25 MG capsule Take 1 capsule (25 mg total) by mouth every 8 (eight) hours as needed.   mupirocin  ointment (BACTROBAN ) 2 %  Apply 1 Application topically 2 (two) times daily. To affect areas on legs   sulfamethoxazole -trimethoprim  (BACTRIM  DS) 800-160 MG tablet Take 2 tablets by mouth 2 (two) times daily for 7 days.   cyclobenzaprine  (FLEXERIL ) 5 MG tablet Take 1-2 tablets (5-10 mg total) by mouth every 8 (eight) hours as needed for muscle spasms. (Patient not taking: Reported on 09/02/2023)   etodolac  (LODINE ) 500 MG tablet Take 1 tablet (500 mg total) by mouth 2 (two) times daily. (Patient not taking: Reported on 09/02/2023)    naproxen  (NAPROSYN ) 500 MG tablet Take 1 tablet (500 mg total) by mouth 2 (two) times daily with a meal. (Patient not taking: Reported on 09/02/2023)   predniSONE  (DELTASONE ) 10 MG tablet Take 6 tabs (60 mg) PO x 3 days, then take 4 tabs (40 mg) PO x 3 days, then take 2 tabs (20 mg) PO x 3 days, then take 1 tab (10 mg) PO x 3 days, then take 1/2 tab (5 mg) PO x 4 days. (Patient not taking: Reported on 09/02/2023)   No facility-administered encounter medications on file as of 09/02/2023.    No past medical history on file.  Past Surgical History:  Procedure Laterality Date   APPENDECTOMY      No family history on file.  Social History   Socioeconomic History   Marital status: Married    Spouse name: Not on file   Number of children: Not on file   Years of education: Not on file   Highest education level: Not on file  Occupational History   Not on file  Tobacco Use   Smoking status: Never   Smokeless tobacco: Not on file  Substance and Sexual Activity   Alcohol use: Yes   Drug use: No   Sexual activity: Not on file  Other Topics Concern   Not on file  Social History Narrative   Not on file   Social Drivers of Health   Financial Resource Strain: Not on file  Food Insecurity: Not on file  Transportation Needs: Not on file  Physical Activity: Not on file  Stress: Not on file  Social Connections: Not on file  Intimate Partner Violence: Not on file    ROS      Objective    BP 125/85 (BP Location: Right Arm, Patient Position: Sitting, Cuff Size: Large)   Pulse 99   Resp 16   Ht 6' 2 (1.88 m)   Wt (!) 330 lb (149.7 kg)   SpO2 98%   BMI 42.37 kg/m   Physical Exam Constitutional:      General: He is not in acute distress.    Appearance: Normal appearance. He is morbidly obese. He is not ill-appearing, toxic-appearing or diaphoretic.  HENT:     Head: Normocephalic.     Nose: Nose normal.     Mouth/Throat:     Mouth: Mucous membranes are moist.  Eyes:      Extraocular Movements: Extraocular movements intact.     Conjunctiva/sclera: Conjunctivae normal.     Pupils: Pupils are equal, round, and reactive to light.  Neck:     Vascular: No carotid bruit.  Cardiovascular:     Rate and Rhythm: Normal rate and regular rhythm.     Pulses:          Radial pulses are 2+ on the right side and 2+ on the left side.       Dorsalis pedis pulses are 1+ on the right side and 1+ on the  left side.       Posterior tibial pulses are 1+ on the right side and 1+ on the left side.     Heart sounds: No murmur heard.    No friction rub. No gallop.  Pulmonary:     Effort: Pulmonary effort is normal. No respiratory distress.     Breath sounds: Normal breath sounds. No stridor. No wheezing, rhonchi or rales.  Chest:     Chest wall: No tenderness.  Abdominal:     General: Abdomen is protuberant.  Musculoskeletal:     Right lower leg: Edema present.     Left lower leg: Edema present.  Feet:     Right foot:     Skin integrity: Erythema and callus present.     Toenail Condition: Right toenails are abnormally thick and long.     Left foot:     Skin integrity: Erythema and callus present.     Toenail Condition: Left toenails are abnormally thick and long.  Skin:    General: Skin is warm.     Capillary Refill: Capillary refill takes less than 2 seconds.     Findings: Erythema and rash present. Rash is crusting and pustular.     Comments: ruddy  Neurological:     General: No focal deficit present.     Mental Status: He is alert and oriented to person, place, and time. Mental status is at baseline.     Cranial Nerves: No cranial nerve deficit.     Sensory: No sensory deficit.     Motor: No weakness.     Coordination: Coordination normal.     Gait: Gait normal.  Psychiatric:        Attention and Perception: He is inattentive.        Mood and Affect: Mood normal. Affect is flat.        Speech: Speech normal.        Behavior: Behavior normal. Behavior is  cooperative.        Thought Content: Thought content normal.        Cognition and Memory: Cognition and memory normal.        Judgment: Judgment normal.            Assessment & Plan:  Assessment and Plan Assessment & Plan Bilateral lower extremity edema with chronic skin changes and open wounds Chronic bilateral lower extremity edema with significant swelling and chronic skin changes since May. Previous venous ultrasound 2021 was unremarkable other than Left bakers cyst. Recent blood tests in May showed normal kidney function. Differential includes vascular issues and potential cardiac involvement. Rash has improved but was previously extensive. Itching exacerbated by dryness. No shortness of breath or cardiac symptoms reported. Increased swelling since working from home, possibly due to decreased mobility. - Order venous and arterial ultrasounds of bilateral lower extremities - Order echocardiogram to rule out cardiac causes of edema - Prescribe Bactrim , two tablets, twice a day for seven days for cellulitis concerns - Prescribe Mupirocin  BID for affects open areas on legs - Prescribe Hydroxyzine  as needed for pruritic dermatitis - Elevate legs to help promote venous return. - Refer to vascular surgery - Refer to dermatology - Check BNP  Morbid obesity due to excess calories Morbid obesity likely contributing to edema. Discussed dietary habits, including high intake of soda and milk. Encouraged moderation and increased water intake. Discussed potential benefits of consulting a nutritionist for dietary guidance. Reports being a large individual due to sports involvement, with challenges in  changing long-standing dietary habits. - Recommend consultation with a nutritionist - Encourage increased water intake to 100-125 ounces daily - Advise moderation in soda and milk consumption - water drink of choice - Encourage dietary changes focusing on lean proteins and portion control -  Increase cardiac exercise mixed in with lifting weights -  Check Lipid, CMP, CBC, TSH, A1c  Elevated blood pressure Diastolic blood pressure slightly elevated at 85 mmHg.  GOAL<120/80  - Discussed lifestyle modifications to manage blood pressure, including dietary changes and increased physical activity. Current lifestyle includes regular exercise but dietary habits may contribute to elevated blood pressure. - Encourage lifestyle modifications including exercise and dietary changes to reduce sodium intake  Screening for diabetes mellitus Previous blood glucose levels were elevated at 107 and 119 mg/dL, considered borderline. Discussed the importance of screening for diabetes given the elevated levels and family history. Plan to assess hemoglobin A1c for a more comprehensive evaluation. Consumes high sugar beverages, which may contribute to elevated glucose levels. - Order hemoglobin A1c test to assess for diabetes mellitus   Bilateral lower extremity edema -     Comprehensive metabolic panel with GFR -     Brain natriuretic peptide -     Ambulatory referral to Vascular Surgery -     ECHOCARDIOGRAM COMPLETE -     US  Venous Img Lower Bilateral (DVT) -     US  ARTERIAL LOWER EXTREMITY DUPLEX BILATERAL  Morbid obesity (HCC) -     CBC with Differential/Platelet -     Comprehensive metabolic panel with GFR -     Hemoglobin A1c -     TSH -     Lipid panel -     Amb ref to Medical Nutrition Therapy-MNT  Screening for viral disease -     HIV Antibody (routine testing w rflx) -     Hepatitis C antibody  Cellulitis of lower extremity, unspecified laterality -     Ambulatory referral to Dermatology -     Ambulatory referral to Vascular Surgery -     Mupirocin ; Apply 1 Application topically 2 (two) times daily. To affect areas on legs  Dispense: 22 g; Refill: 4 -     Sulfamethoxazole -Trimethoprim ; Take 2 tablets by mouth 2 (two) times daily for 7 days.  Dispense: 28 tablet; Refill:  0  Pruritic dermatitis -     hydrOXYzine  Pamoate; Take 1 capsule (25 mg total) by mouth every 8 (eight) hours as needed.  Dispense: 30 capsule; Refill: 0    No follow-ups on file.   Curtis DELENA Boom, FNP

## 2023-09-03 ENCOUNTER — Ambulatory Visit: Payer: Self-pay | Admitting: Family Medicine

## 2023-09-03 ENCOUNTER — Other Ambulatory Visit: Payer: Self-pay | Admitting: Family Medicine

## 2023-09-03 DIAGNOSIS — R600 Localized edema: Secondary | ICD-10-CM

## 2023-09-03 LAB — CBC WITH DIFFERENTIAL/PLATELET
Basophils Absolute: 0 x10E3/uL (ref 0.0–0.2)
Basos: 1 %
EOS (ABSOLUTE): 0.1 x10E3/uL (ref 0.0–0.4)
Eos: 2 %
Hematocrit: 46.3 % (ref 37.5–51.0)
Hemoglobin: 15.1 g/dL (ref 13.0–17.7)
Immature Grans (Abs): 0 x10E3/uL (ref 0.0–0.1)
Immature Granulocytes: 0 %
Lymphocytes Absolute: 1.5 x10E3/uL (ref 0.7–3.1)
Lymphs: 25 %
MCH: 29.3 pg (ref 26.6–33.0)
MCHC: 32.6 g/dL (ref 31.5–35.7)
MCV: 90 fL (ref 79–97)
Monocytes Absolute: 0.6 x10E3/uL (ref 0.1–0.9)
Monocytes: 9 %
Neutrophils Absolute: 3.7 x10E3/uL (ref 1.4–7.0)
Neutrophils: 63 %
Platelets: 118 x10E3/uL — ABNORMAL LOW (ref 150–450)
RBC: 5.15 x10E6/uL (ref 4.14–5.80)
RDW: 12.6 % (ref 11.6–15.4)
WBC: 5.9 x10E3/uL (ref 3.4–10.8)

## 2023-09-03 LAB — COMPREHENSIVE METABOLIC PANEL WITH GFR
ALT: 21 IU/L (ref 0–44)
AST: 22 IU/L (ref 0–40)
Albumin: 4.2 g/dL (ref 4.1–5.1)
Alkaline Phosphatase: 85 IU/L (ref 44–121)
BUN/Creatinine Ratio: 14 (ref 9–20)
BUN: 15 mg/dL (ref 6–24)
Bilirubin Total: 0.6 mg/dL (ref 0.0–1.2)
CO2: 25 mmol/L (ref 20–29)
Calcium: 9.3 mg/dL (ref 8.7–10.2)
Chloride: 99 mmol/L (ref 96–106)
Creatinine, Ser: 1.09 mg/dL (ref 0.76–1.27)
Globulin, Total: 3.1 g/dL (ref 1.5–4.5)
Glucose: 114 mg/dL — ABNORMAL HIGH (ref 70–99)
Potassium: 3.9 mmol/L (ref 3.5–5.2)
Sodium: 138 mmol/L (ref 134–144)
Total Protein: 7.3 g/dL (ref 6.0–8.5)
eGFR: 85 mL/min/1.73 (ref >59)

## 2023-09-03 LAB — TSH: TSH: 2.01 u[IU]/mL (ref 0.450–4.500)

## 2023-09-03 LAB — LIPID PANEL
Chol/HDL Ratio: 4.5 ratio (ref 0.0–5.0)
Cholesterol, Total: 154 mg/dL (ref 100–199)
HDL: 34 mg/dL — ABNORMAL LOW (ref >39)
LDL Chol Calc (NIH): 90 mg/dL (ref 0–99)
Triglycerides: 172 mg/dL — ABNORMAL HIGH (ref 0–149)
VLDL Cholesterol Cal: 30 mg/dL (ref 5–40)

## 2023-09-03 LAB — BRAIN NATRIURETIC PEPTIDE: BNP: 65 pg/mL (ref 0.0–100.0)

## 2023-09-03 LAB — HEMOGLOBIN A1C
Est. average glucose Bld gHb Est-mCnc: 105 mg/dL
Hgb A1c MFr Bld: 5.3 % (ref 4.8–5.6)

## 2023-09-03 LAB — HIV ANTIBODY (ROUTINE TESTING W REFLEX): HIV Screen 4th Generation wRfx: NONREACTIVE

## 2023-09-03 LAB — HEPATITIS C ANTIBODY: Hep C Virus Ab: NONREACTIVE

## 2023-09-08 ENCOUNTER — Ambulatory Visit

## 2023-11-16 ENCOUNTER — Ambulatory Visit
